# Patient Record
Sex: Female | Born: 1935
Health system: Southern US, Community
[De-identification: ages and names within clinical notes are randomized; demographics above are authoritative.]

## PROBLEM LIST (undated history)

## (undated) HISTORY — PX: CHOLECYSTECTOMY: SHX55

---

## 2017-03-26 ENCOUNTER — Encounter: Payer: Self-pay | Admitting: Emergency Medicine

## 2017-03-26 ENCOUNTER — Emergency Department (INDEPENDENT_AMBULATORY_CARE_PROVIDER_SITE_OTHER)
Admission: EM | Admit: 2017-03-26 | Discharge: 2017-03-26 | Disposition: A | Discharge status: Home / Self Care | Payer: Medicare Other | Source: Home / Self Care

## 2017-03-26 ENCOUNTER — Emergency Department (INDEPENDENT_AMBULATORY_CARE_PROVIDER_SITE_OTHER): Payer: Medicare Other

## 2017-03-26 DIAGNOSIS — J40 Bronchitis, not specified as acute or chronic: Secondary | ICD-10-CM

## 2017-03-26 DIAGNOSIS — R05 Cough: Secondary | ICD-10-CM

## 2017-03-26 DIAGNOSIS — R059 Cough, unspecified: Secondary | ICD-10-CM

## 2017-03-26 MED ORDER — AZITHROMYCIN 250 MG PO TABS: 250.0000 mg | ORAL_TABLET | Freq: Every day | ORAL | 0 refills | Status: DC

## 2017-03-26 MED ORDER — BENZONATATE 100 MG PO CAPS: 100.0000 mg | ORAL_CAPSULE | Freq: Three times a day (TID) | ORAL | 0 refills | Status: DC

## 2017-03-26 NOTE — ED Triage Notes (Signed)
[facility] with [facility]/O cough productive white sputum, nasal congestion, chills, possible fever. Symptoms times two weeks, [place] She advised that she has mold growing in her apartment.

## 2017-03-26 NOTE — Discharge Instructions (Signed)
Return if any problems.

## 2017-03-27 NOTE — ED Provider Notes (Signed)
[facility]    CSN: [id number] Arrival date & time: 03/26/17  1142     History   Chief Complaint Chief Complaint  Patient presents with  . Cough  . Nasal Congestion  . Chills    HPI Brenda Donovan is a 83 y.o. female.   The history is provided by the patient. No language interpreter was used.  Cough  Cough characteristics:  Productive Sputum characteristics:  Nondescript Severity:  Moderate Onset quality:  Gradual Duration:  2 weeks Timing:  Constant Chronicity:  New Smoker: no   Relieved by:  Nothing Worsened by:  Nothing Ineffective treatments:  None tried Associated symptoms: no fever   Risk factors: no recent infection   Pt complains of cough an congestion.  Pt reports she has been sick for 2 weeks.    History reviewed. No pertinent past medical history.  There are no active problems to display for this patient.   History reviewed. No pertinent surgical history.  OB History   None      Home Medications    Prior to Admission medications   Medication Sig Start Date End Date Taking? Authorizing Provider  fluticasone (VERAMYST) 27.5 MCG/SPRAY nasal spray Place 2 sprays into the nose daily.   Yes [provider]  guaiFENesin (ROBITUSSIN) 100 MG/5ML SOLN Take 5 mLs by mouth every 4 (four) hours as needed for cough or to loosen phlegm.   Yes [provider]  azithromycin (ZITHROMAX) 250 MG tablet Take 1 tablet (250 mg total) by mouth daily. Take first 2 tablets together, then 1 every day until finished. 03/26/17   Elson AreasSofia, Elliott Quade K, PA-C  benzonatate (TESSALON) 100 MG capsule Take 1 capsule (100 mg total) by mouth every 8 (eight) hours. 03/26/17   Elson AreasSofia, Kassidie Hendriks K, PA-C    Family History History reviewed. No pertinent family history.  Social History Social History   Tobacco Use  . Smoking status: Never Smoker  . Smokeless tobacco: Never Used  Substance Use Topics  . Alcohol use: Never    Frequency: Never  . Drug use: Never       Allergies   Patient has no allergy information on record.   Review of Systems Review of Systems  Constitutional: Negative for fever.  Respiratory: Positive for cough.   All other systems reviewed and are negative.    Physical Exam Triage Vital Signs ED Triage Vitals  Enc Vitals Group     BP 03/26/17 1231 (!) 148/80     Pulse Rate 03/26/17 1231 (!) 59     Resp 03/26/17 1231 16     Temp 03/26/17 1231 98.2 F (36.8 C)     Temp Source 03/26/17 1231 Oral     SpO2 03/26/17 1231 100 %     Weight 03/26/17 1233 126 lb (57.2 kg)     Height 03/26/17 1233 4\' 10"  (1.473 m)     Head Circumference --      Peak Flow --      Pain Score 03/26/17 1233 (S) 0     Pain Loc --      Pain Edu? --      Excl. in GC? --    No data found.  Updated Vital Signs BP (!) 148/80 (BP Location: Left Arm)   Pulse (!) 59   Temp 98.2 F (36.8 C) (Oral)   Resp 16   Ht 4\' 10"  (1.473 m)   Wt 126 lb (57.2 kg)   SpO2 100%   BMI 26.33 kg/m  Visual Acuity Right Eye Distance:   Left Eye Distance:   Bilateral Distance:    Right Eye Near:   Left Eye Near:    Bilateral Near:     Physical Exam  Constitutional: She appears well-developed and well-nourished. No distress.  HENT:  Head: Normocephalic and atraumatic.  Right Ear: External ear normal.  Left Ear: External ear normal.  Nose: Nose normal.  Mouth/Throat: Oropharynx is clear and moist.  Eyes: Conjunctivae are normal.  Neck: Neck supple.  Cardiovascular: Normal rate and regular rhythm.  No murmur heard. Pulmonary/Chest: Effort normal and breath sounds normal. No respiratory distress.  Abdominal: Soft. There is no tenderness.  Musculoskeletal: She exhibits no edema.  Neurological: She is alert.  Skin: Skin is warm and dry.  Psychiatric: She has a normal mood and affect.  Nursing note and vitals reviewed.    UC Treatments / Results  Labs (all labs ordered are listed, but only abnormal results are displayed) Labs Reviewed - No  data to display  EKG None Radiology Dg Chest 2 View  Result Date: 03/26/2017 CLINICAL DATA:  83 year old with productive cough, nasal congestion and chills. EXAM: CHEST - 2 VIEW COMPARISON:  None. FINDINGS: Both lungs are clear. Heart and mediastinum are within normal limits. No large pleural effusions. Mild degenerative changes in the thoracic spine. IMPRESSION: No active cardiopulmonary disease. Electronically Signed   By: Richarda Overlie M.D.   On: 03/26/2017 12:54    Procedures Procedures (including critical care time)  Medications Ordered in UC Medications - No data to display   Initial Impression / Assessment and Plan / UC Course  I have reviewed the triage vital signs and the nursing notes.  Pertinent labs & imaging results that were available during my care of the patient were reviewed by me and considered in my medical decision making (see chart for details).       Final Clinical Impressions(s) / UC Diagnoses   Final diagnoses:  Cough  Bronchitis    ED Discharge Orders        Ordered    azithromycin (ZITHROMAX) 250 MG tablet  Daily     03/26/17 1308    benzonatate (TESSALON) 100 MG capsule  Every 8 hours     03/26/17 1308     An After Visit Summary was printed and given to the patient.  Controlled Substance Prescriptions [place] Controlled Substance Registry consulted? Not Applicable   Elson Areas, [place] 03/27/17 1444

## 2018-06-26 DIAGNOSIS — K219 Gastro-esophageal reflux disease without esophagitis: Secondary | ICD-10-CM | POA: Insufficient documentation

## 2018-07-26 IMAGING — DX DG CHEST 2V
2 series · 2 of 2 positions shown · non-contrast
Comparison: None.

CLINICAL DATA: 82-year-old with productive cough, nasal congestion
and chills.

EXAM:
CHEST - 2 VIEW

[chest pa]
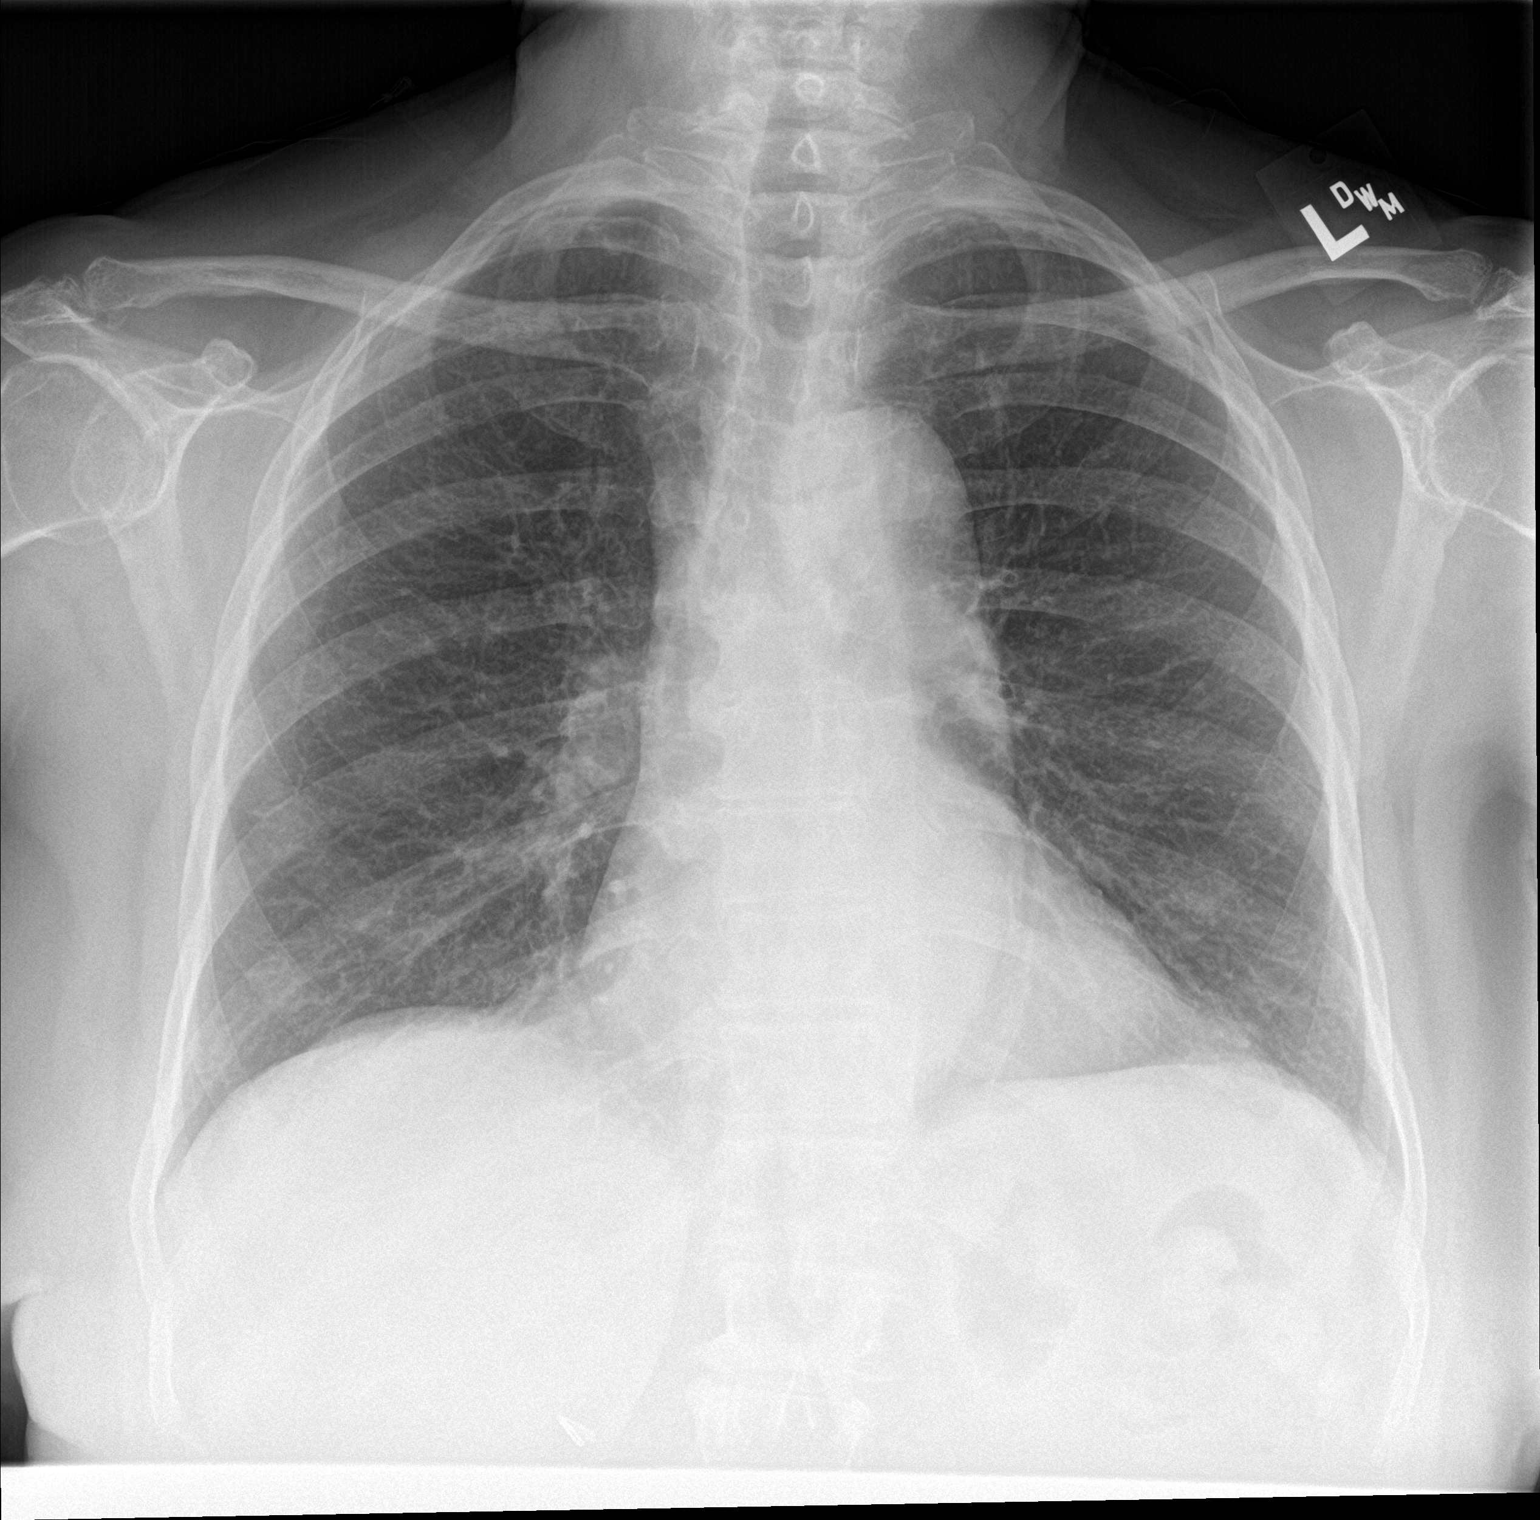

[chest lat]
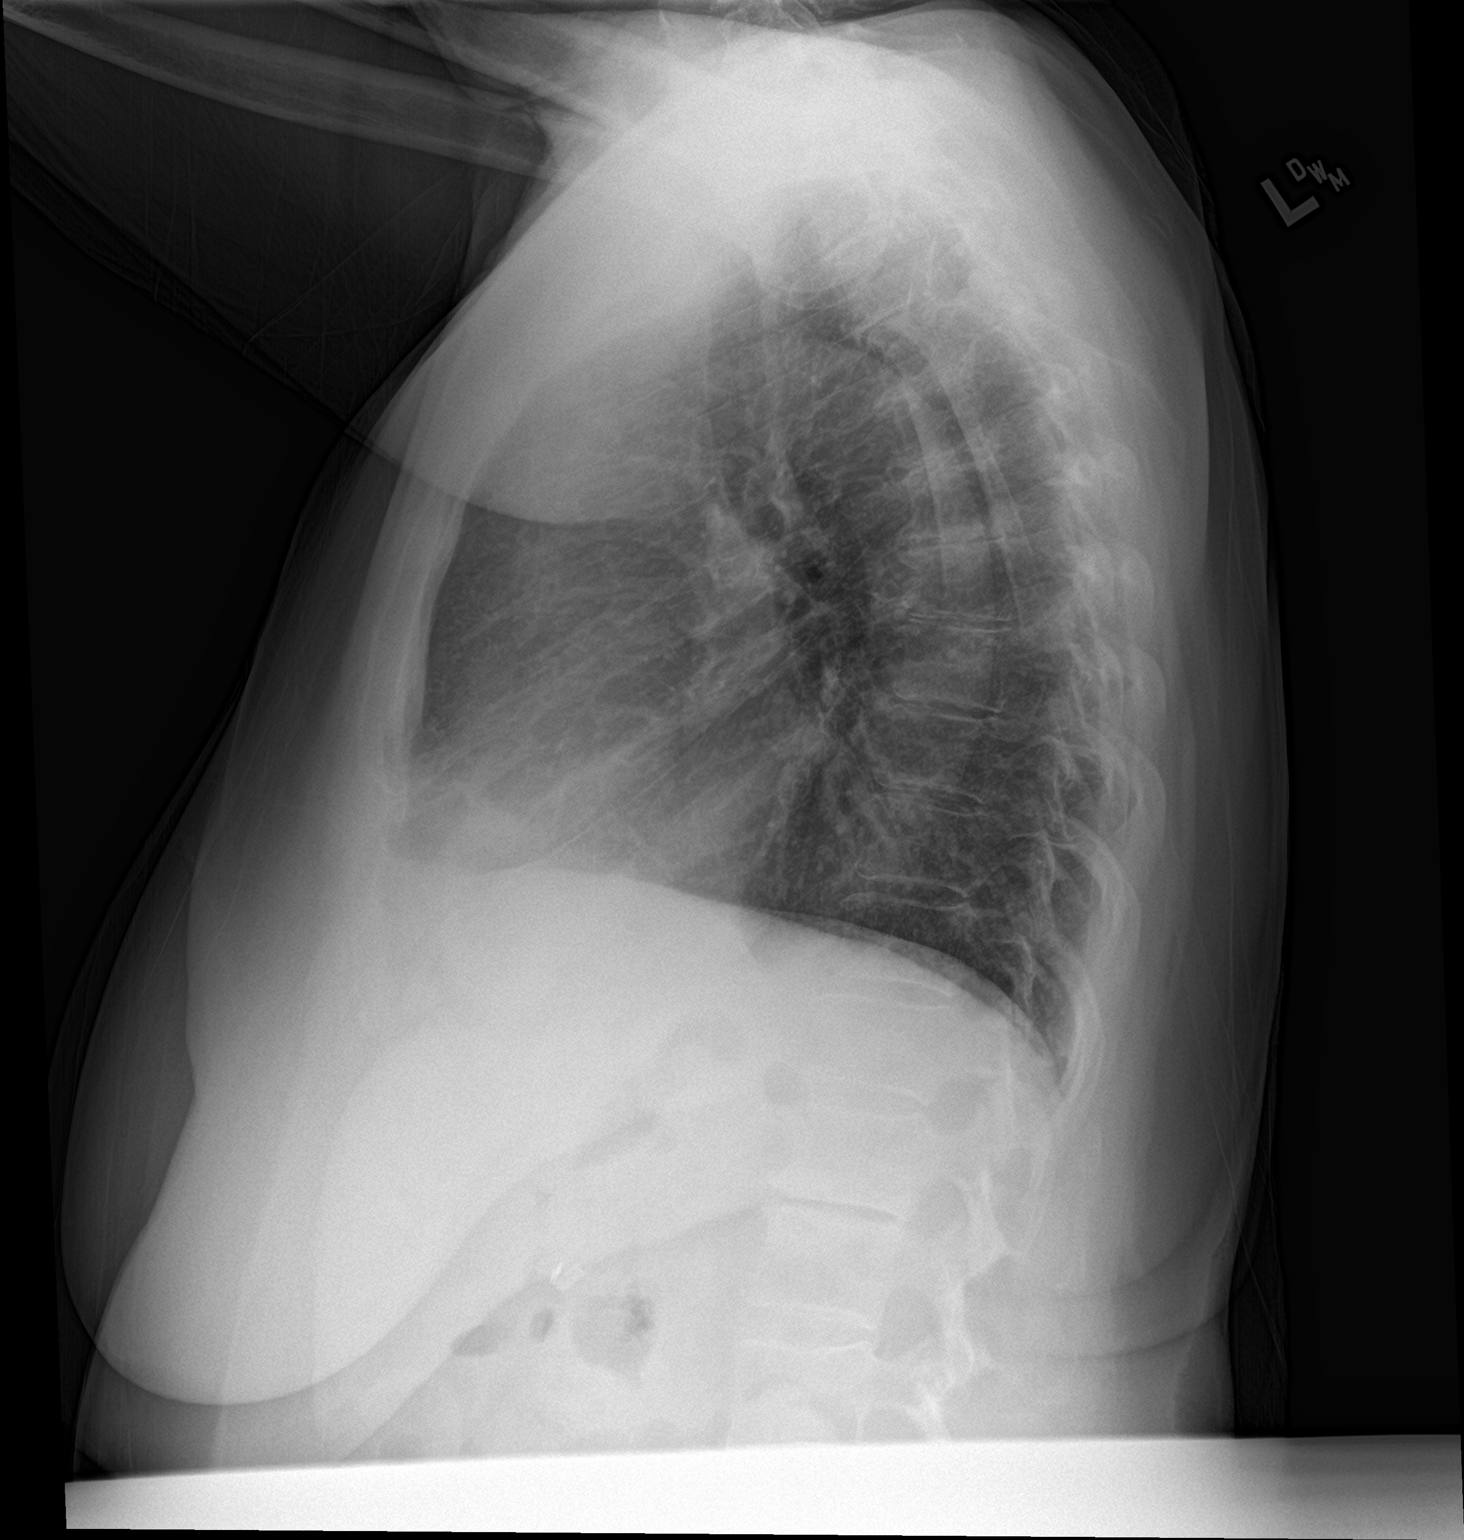

[2 of 2 positions shown; findings below may reference images not displayed]

FINDINGS: Both lungs are clear. Heart and mediastinum are within normal
limits. No large pleural effusions. Mild degenerative changes in the
thoracic spine.
IMPRESSION: No active cardiopulmonary disease.

## 2019-08-13 ENCOUNTER — Ambulatory Visit: Payer: Self-pay

## 2019-09-04 ENCOUNTER — Emergency Department (INDEPENDENT_AMBULATORY_CARE_PROVIDER_SITE_OTHER)
Admission: RE | Admit: 2019-09-04 | Discharge: 2019-09-04 | Disposition: A | Discharge status: Home / Self Care | Payer: Medicare Other | Source: Ambulatory Visit | Attending: Family Medicine | Admitting: Family Medicine

## 2019-09-04 ENCOUNTER — Other Ambulatory Visit: Payer: Self-pay

## 2019-09-04 VITALS — BP 119/78 | HR 64 | Temp 98.3°F | Resp 18 | Ht 59.0 in | Wt 120.0 lb

## 2019-09-04 DIAGNOSIS — M791 Myalgia, unspecified site: Secondary | ICD-10-CM | POA: Diagnosis not present

## 2019-09-04 MED ORDER — PREDNISONE 20 MG PO TABS: ORAL_TABLET | ORAL | 0 refills | Status: DC

## 2019-09-04 NOTE — Discharge Instructions (Signed)
May take Tylenol as needed for pain. 

## 2019-09-04 NOTE — ED Provider Notes (Signed)
[facility]    CSN: [id number] Arrival date & time: 09/04/19  4193      History   Chief Complaint Chief Complaint  Patient presents with  . Cough    HPI Brenda Donovan is a 85 y.o. female.   Patient is Spanish speaking and daughter serves as Engineer, technical sales. Patient complains of three day history of vague tightness in her upper posterior chest without shortness of breath or cough.  She denies fevers, chills, and sweats and feels well otherwise.  She reports that she has a history of seasonal allergic rhinitis and sometimes has chest aches during flare-ups.  The history is provided by the patient and a relative. The history is limited by a language barrier. No language interpreter was used.    History reviewed. No pertinent past medical history.  There are no problems to display for this patient.   History reviewed. No pertinent surgical history.  OB History   No obstetric history on file.      Home Medications    Prior to Admission medications   Medication Sig Start Date End Date Taking? Authorizing Provider  guaiFENesin (ROBITUSSIN) 100 MG/5ML SOLN Take 5 mLs by mouth every 4 (four) hours as needed for cough or to loosen phlegm.    [provider]  predniSONE (DELTASONE) 20 MG tablet Take one tab by mouth twice daily for 4 days, then one daily for 3 days. Take with food. 09/04/19   Lattie Haw, MD    Family History History reviewed. No pertinent family history.  Social History Social History   Tobacco Use  . Smoking status: Never Smoker  . Smokeless tobacco: Never Used  Vaping Use  . Vaping Use: Never used  Substance Use Topics  . Alcohol use: Never  . Drug use: Never     Allergies   Patient has no known allergies.   Review of Systems Review of Systems  No sore throat No cough No pleuritic pain No wheezing No nasal congestion No post-nasal drainage No sinus pain/pressure No itchy/red eyes No earache No hemoptysis No  SOB No fever/chills No nausea No vomiting No abdominal pain No diarrhea No urinary symptoms No skin rash No fatigue No myalgias No headache    Physical Exam Triage Vital Signs ED Triage Vitals  Enc Vitals Group     BP 09/04/19 1209 119/78     Pulse Rate 09/04/19 1209 64     Resp 09/04/19 1209 18     Temp 09/04/19 1209 98.3 F (36.8 C)     Temp Source 09/04/19 1209 Oral     SpO2 09/04/19 1209 96 %     Weight 09/04/19 1210 120 lb (54.4 kg)     Height 09/04/19 1210 4\' 11"  (1.499 m)     Head Circumference --      Peak Flow --      Pain Score 09/04/19 1209 2     Pain Loc --      Pain Edu? --      Excl. in GC? --    No data found.  Updated Vital Signs BP 119/78 (BP Location: Right Arm)   Pulse 64   Temp 98.3 F (36.8 C) (Oral)   Resp 18   Ht 4\' 11"  (1.499 m)   Wt 54.4 kg   SpO2 96%   BMI 24.24 kg/m   Visual Acuity Right Eye Distance:   Left Eye Distance:   Bilateral Distance:    Right Eye Near:   Left  Eye Near:    Bilateral Near:     Physical Exam Nursing notes and Vital Signs reviewed. Appearance:  Patient appears stated age, and in no acute distress Eyes:  Pupils are equal, round, and reactive to light and accomodation.  Extraocular movement is intact.  Conjunctivae are not inflamed  Ears:  Canals normal.  Tympanic membranes normal.  Nose:  Mildly congested turbinates.  No sinus tenderness.  Neck:  Supple.  Mildly enlarged lateral nodes are present, tender to palpation on the left.   Lungs:  Clear to auscultation.  Breath sounds are equal.  Moving air well. Chest:  Mild posterior upper thoracic tenderness to palpation. Heart:  Regular rate and rhythm without murmurs, rubs, or gallops.  Abdomen:  Nontender without masses or hepatosplenomegaly.  Bowel sounds are present.  No CVA or flank tenderness.  Extremities:  No edema.  Skin:  No rash present.   UC Treatments / Results  Labs (all labs ordered are listed, but only abnormal results are  displayed) Labs Reviewed - No data to display  EKG   Radiology No results found.  Procedures Procedures (including critical care time)  Medications Ordered in UC Medications - No data to display  Initial Impression / Assessment and Plan / UC Course  I have reviewed the triage vital signs and the nursing notes.  Pertinent labs & imaging results that were available during my care of the patient were reviewed by me and considered in my medical decision making (see chart for details).    Suspect an early viral URI causing patient's upper back soreness Begin prednisone burst/taper. Followup with Family Doctor if not improved in one week.   Final Clinical Impressions(s) / UC Diagnoses   Final diagnoses:  Myalgia     Discharge Instructions     May take Tylenol as needed for pain.   ED Prescriptions    Medication Sig Dispense Auth. Provider   predniSONE (DELTASONE) 20 MG tablet Take one tab by mouth twice daily for 4 days, then one daily for 3 days. Take with food. 11 tablet Lattie Haw, MD        Lattie Haw, MD 09/06/19 1140

## 2019-09-04 NOTE — ED Triage Notes (Signed)
Dry cough and congestion x 2 days Vaccinated

## 2019-12-10 ENCOUNTER — Other Ambulatory Visit: Payer: Self-pay

## 2019-12-10 ENCOUNTER — Emergency Department (INDEPENDENT_AMBULATORY_CARE_PROVIDER_SITE_OTHER): Payer: Medicare Other

## 2019-12-10 ENCOUNTER — Emergency Department (INDEPENDENT_AMBULATORY_CARE_PROVIDER_SITE_OTHER)
Admission: RE | Admit: 2019-12-10 | Discharge: 2019-12-10 | Disposition: A | Discharge status: Home / Self Care | Payer: Medicare Other | Source: Ambulatory Visit

## 2019-12-10 VITALS — BP 102/68 | HR 69 | Temp 99.0°F | Resp 17 | Ht 59.0 in | Wt 121.0 lb

## 2019-12-10 DIAGNOSIS — J069 Acute upper respiratory infection, unspecified: Secondary | ICD-10-CM | POA: Diagnosis not present

## 2019-12-10 DIAGNOSIS — R0989 Other specified symptoms and signs involving the circulatory and respiratory systems: Secondary | ICD-10-CM

## 2019-12-10 MED ORDER — FLUTICASONE PROPIONATE 50 MCG/ACT NA SUSP: 1.0000 | Freq: Every day | NASAL | 0 refills | Status: DC

## 2019-12-10 NOTE — ED Triage Notes (Signed)
Sore throat & chills x 1 week  Pfizer vaccine 3/21 - due for booster Had flu vaccine  No Tylenol today  Sinus drainage

## 2019-12-10 NOTE — Discharge Instructions (Signed)
  You may use the nasal spray, Flonase, to help with your symptoms. Follow up with family medicine later this week or next week if not improving, especially if new symptoms develop- cough, fever, pain.

## 2019-12-10 NOTE — ED Provider Notes (Signed)
[facility]    CSN: [id number] Arrival date & time: 12/10/19  1800      History   Chief Complaint Chief Complaint  Patient presents with  . Appointment  . Chills  . Sore Throat    HPI Brenda Donovan is a 85 y.o. female.   HPI Brenda Donovan is a 85 y.o. female presenting to UC with c/o nasal congestion, post-nasal drainage, sore throat.  Hx of sinus allergies. No known sick contacts.  No cough. No fever. Pfizer COVID vaccine in March 2021.     History reviewed. No pertinent past medical history.  Patient Active Problem List   Diagnosis Date Noted  . Gastroesophageal reflux disease without esophagitis 06/26/2018    History reviewed. No pertinent surgical history.  OB History   No obstetric history on file.      Home Medications    Prior to Admission medications   Medication Sig Start Date End Date Taking? Authorizing Provider  Calcium Carbonate-Vitamin D 600-400 MG-UNIT tablet Take 1 tablet by mouth daily. 02/19/19  Yes [provider]  cetirizine (ZYRTEC) 10 MG tablet Take by mouth.   Yes [provider]  dorzolamide-timolol (COSOPT) 22.3-6.8 MG/ML ophthalmic solution Place 1 drop into both eyes 2 (two) times daily. 12/13/18  Yes [provider]  esomeprazole (NEXIUM) 20 MG capsule Take by mouth. 02/19/19  Yes [provider]  latanoprost (XALATAN) 0.005 % ophthalmic solution Place 1 drop into both eyes at bedtime. 01/10/19  Yes [provider]  fluticasone (FLONASE) 50 MCG/ACT nasal spray Place 1-2 sprays into both nostrils daily. 12/10/19   Lurene Shadow, PA-C  guaiFENesin (ROBITUSSIN) 100 MG/5ML SOLN Take 5 mLs by mouth every 4 (four) hours as needed for cough or to loosen phlegm. Patient not taking: Reported on 12/10/2019    [provider]  predniSONE (DELTASONE) 20 MG tablet Take one tab by mouth twice daily for 4 days, then one daily for 3 days. Take with food. Patient not taking: Reported on  12/10/2019 09/04/19   Lattie Haw, MD    Family History History reviewed. No pertinent family history.  Social History Social History   Tobacco Use  . Smoking status: Never Smoker  . Smokeless tobacco: Never Used  Vaping Use  . Vaping Use: Never used  Substance Use Topics  . Alcohol use: Never  . Drug use: Never     Allergies   Patient has no known allergies.   Review of Systems Review of Systems  Constitutional: Negative for chills and fever.  HENT: Positive for congestion, postnasal drip, rhinorrhea and sore throat. Negative for ear pain, trouble swallowing and voice change.   Respiratory: Negative for cough and shortness of breath.   Cardiovascular: Negative for chest pain and palpitations.  Gastrointestinal: Negative for abdominal pain, diarrhea, nausea and vomiting.  Musculoskeletal: Negative for arthralgias, back pain and myalgias.  Skin: Negative for rash.  All other systems reviewed and are negative.    Physical Exam Triage Vital Signs ED Triage Vitals  Enc Vitals Group     BP 12/10/19 1815 102/68     Pulse Rate 12/10/19 1815 69     Resp 12/10/19 1815 17     Temp 12/10/19 1815 99 F (37.2 C)     Temp Source 12/10/19 1815 Oral     SpO2 12/10/19 1815 95 %     Weight 12/10/19 1818 121 lb (54.9 kg)     Height 12/10/19 1818 4\' 11"  (1.499 m)  Head Circumference --      Peak Flow --      Pain Score 12/10/19 1817 1     Pain Loc --      Pain Edu? --      Excl. in GC? --    No data found.  Updated Vital Signs BP 102/68 (BP Location: Right Arm)   Pulse 69   Temp 99 F (37.2 C) (Oral)   Resp 17   Ht 4\' 11"  (1.499 m)   Wt 121 lb (54.9 kg)   SpO2 95%   BMI 24.44 kg/m   Visual Acuity Right Eye Distance:   Left Eye Distance:   Bilateral Distance:    Right Eye Near:   Left Eye Near:    Bilateral Near:     Physical Exam Vitals and nursing note reviewed.  Constitutional:      General: She is not in acute distress.    Appearance: She is  well-developed. She is not ill-appearing, toxic-appearing or diaphoretic.  HENT:     Head: Normocephalic and atraumatic.     Right Ear: Tympanic membrane and ear canal normal.     Left Ear: Tympanic membrane and ear canal normal.     Nose: Nose normal.     Right Sinus: No maxillary sinus tenderness or frontal sinus tenderness.     Left Sinus: No maxillary sinus tenderness or frontal sinus tenderness.     Mouth/Throat:     Lips: Pink.     Mouth: Mucous membranes are moist.     Pharynx: Oropharynx is clear. Uvula midline.  Cardiovascular:     Rate and Rhythm: Normal rate and regular rhythm.  Pulmonary:     Effort: Pulmonary effort is normal. No respiratory distress.     Breath sounds: No stridor. Rhonchi (faint in lower lung field) present. No wheezing or rales.  Musculoskeletal:        General: Normal range of motion.     Cervical back: Normal range of motion and neck supple.  Lymphadenopathy:     Cervical: No cervical adenopathy.  Skin:    General: Skin is warm and dry.  Neurological:     Mental Status: She is alert and oriented to person, place, and time.  Psychiatric:        Behavior: Behavior normal.      UC Treatments / Results  Labs (all labs ordered are listed, but only abnormal results are displayed) Labs Reviewed  COVID-19, FLU A+B AND RSV    EKG   Radiology DG Chest 2 View  Result Date: 12/10/2019 CLINICAL DATA:  Chills, chest congestion, faint rhonchi lower lung fields. EXAM: CHEST - 2 VIEW COMPARISON:  Chest x-ray 03/26/2017 FINDINGS: The heart size and mediastinal contours are within normal limits. Trace biapical pleural/pulmonary scarring. Left base atelectasis. No focal consolidation. No pulmonary edema. No pleural effusion. No pneumothorax. No acute osseous abnormality.  Right upper abdomen surgical clips. IMPRESSION: No active cardiopulmonary disease. Electronically Signed   By: 03/28/2017 M.D.   On: 12/10/2019 19:17    Procedures Procedures  (including critical care time)  Medications Ordered in UC Medications - No data to display  Initial Impression / Assessment and Plan / UC Course  I have reviewed the triage vital signs and the nursing notes.  Pertinent labs & imaging results that were available during my care of the patient were reviewed by me and considered in my medical decision making (see chart for details).     No evidence of  bacteria infection at this time. Encouraged symptomatic tx F/u with PCP as needed  Final Clinical Impressions(s) / UC Diagnoses   Final diagnoses:  Viral upper respiratory illness     Discharge Instructions      You may use the nasal spray, Flonase, to help with your symptoms. Follow up with family medicine later this week or next week if not improving, especially if new symptoms develop- cough, fever, pain.     ED Prescriptions    Medication Sig Dispense Auth. Provider   fluticasone (FLONASE) 50 MCG/ACT nasal spray Place 1-2 sprays into both nostrils daily. 16 g Lurene Shadow, [place]     PDMP not reviewed this encounter.   Lurene Shadow, [place] 12/10/19 [phone]

## 2019-12-13 LAB — COVID-19, FLU A+B AND RSV
Influenza A, NAA: NOT DETECTED
Influenza B, NAA: NOT DETECTED
RSV, NAA: NOT DETECTED
SARS-CoV-2, NAA: NOT DETECTED

## 2021-02-16 ENCOUNTER — Other Ambulatory Visit: Payer: Self-pay

## 2021-02-16 ENCOUNTER — Emergency Department (INDEPENDENT_AMBULATORY_CARE_PROVIDER_SITE_OTHER)
Admission: RE | Admit: 2021-02-16 | Discharge: 2021-02-16 | Disposition: A | Discharge status: Home / Self Care | Payer: Medicare Other | Source: Ambulatory Visit | Attending: Family Medicine | Admitting: Family Medicine

## 2021-02-16 VITALS — BP 119/90 | HR 60 | Temp 98.1°F | Resp 14

## 2021-02-16 DIAGNOSIS — H6983 Other specified disorders of Eustachian tube, bilateral: Secondary | ICD-10-CM

## 2021-02-16 DIAGNOSIS — H6993 Unspecified Eustachian tube disorder, bilateral: Secondary | ICD-10-CM

## 2021-02-16 LAB — POC INFLUENZA A AND B ANTIGEN (URGENT CARE ONLY)
Influenza A Ag: NEGATIVE
Influenza B Ag: NEGATIVE

## 2021-02-16 LAB — POC SARS CORONAVIRUS 2 AG -  ED: SARS Coronavirus 2 Ag: NEGATIVE

## 2021-02-16 MED ORDER — FLUTICASONE PROPIONATE 50 MCG/ACT NA SUSP: 1.0000 | Freq: Every day | NASAL | 0 refills | Status: AC

## 2021-02-16 NOTE — ED Provider Notes (Signed)
[facility]    CSN: [id number] Arrival date & time: 02/16/21  1549      History   Chief Complaint Chief Complaint  Patient presents with   Chills   Otalgia    HPI Ellana Kawa is a 87 y.o. female.   HPI  Charnetta is here with her daughter.  Daughter is fluently bilingual.  The patient's English is also good. She has ear problems for the last month or so.  Decreased sensation.  Funny noises.  Itching.  No pain.  No loss of hearing. She has longstanding allergies.  Takes Zyrtec daily. For the last couple of days she has had chills.  Her daughter states that she is always cold.  Has not been having a fever  History reviewed. No pertinent past medical history.  Patient Active Problem List   Diagnosis Date Noted   Gastroesophageal reflux disease without esophagitis 06/26/2018    Past Surgical History:  Procedure Laterality Date   CHOLECYSTECTOMY      OB History   No obstetric history on file.      Home Medications    Prior to Admission medications   Medication Sig Start Date End Date Taking? Authorizing Provider  Calcium Carbonate-Vitamin D 600-400 MG-UNIT tablet Take 1 tablet by mouth daily. 02/19/19   [provider]  cetirizine (ZYRTEC) 10 MG tablet Take by mouth.    [provider]  dorzolamide-timolol (COSOPT) 22.3-6.8 MG/ML ophthalmic solution Place 1 drop into both eyes 2 (two) times daily. 12/13/18   [provider]  esomeprazole (NEXIUM) 20 MG capsule Take by mouth. 02/19/19   [provider]  fluticasone (FLONASE) 50 MCG/ACT nasal spray Place 1-2 sprays into both nostrils daily. 02/16/21   Eustace Moore, MD  latanoprost (XALATAN) 0.005 % ophthalmic solution Place 1 drop into both eyes at bedtime. 01/10/19   [provider]    Family History History reviewed. No pertinent family history.  Social History Social History   Tobacco Use   Smoking status: Never   Smokeless tobacco: Never  Vaping Use    Vaping Use: Never used  Substance Use Topics   Alcohol use: Never   Drug use: Never     Allergies   Patient has no known allergies.   Review of Systems Review of Systems   Physical Exam Triage Vital Signs ED Triage Vitals  Enc Vitals Group     BP 02/16/21 1602 119/90     Pulse Rate 02/16/21 1602 60     Resp 02/16/21 1602 14     Temp 02/16/21 1602 98.1 F (36.7 C)     Temp Source 02/16/21 1602 Oral     SpO2 02/16/21 1602 99 %     Weight --      Height --      Head Circumference --      Peak Flow --      Pain Score 02/16/21 1604 0     Pain Loc --      Pain Edu? --      Excl. in GC? --    No data found.  Updated Vital Signs BP 119/90 (BP Location: Right Arm)    Pulse 60    Temp 98.1 F (36.7 C) (Oral)    Resp 14    SpO2 99%       Physical Exam Constitutional:      General: She is not in acute distress.    Appearance: Normal appearance. She is well-developed.  HENT:  Head: Normocephalic and atraumatic.     Right Ear: Ear canal and external ear normal.     Left Ear: Ear canal and external ear normal.     Ears:     Comments: Both TMs distorted, no erythema    Nose: Nose normal. No congestion.     Mouth/Throat:     Pharynx: No posterior oropharyngeal erythema.  Eyes:     Conjunctiva/sclera: Conjunctivae normal.     Pupils: Pupils are equal, round, and reactive to light.  Cardiovascular:     Rate and Rhythm: Normal rate.  Pulmonary:     Effort: Pulmonary effort is normal. No respiratory distress.  Abdominal:     General: There is no distension.     Palpations: Abdomen is soft.  Musculoskeletal:        General: Normal range of motion.     Cervical back: Normal range of motion.  Lymphadenopathy:     Cervical: No cervical adenopathy.  Skin:    General: Skin is warm and dry.  Neurological:     Mental Status: She is alert.  Psychiatric:        Mood and Affect: Mood normal.        Behavior: Behavior normal.     UC Treatments / Results   Labs (all labs ordered are listed, but only abnormal results are displayed) Labs Reviewed  POC SARS CORONAVIRUS 2 AG -  ED  POC INFLUENZA A AND B ANTIGEN (URGENT CARE ONLY)    EKG   Radiology No results found.  Procedures Procedures (including critical care time)  Medications Ordered in UC Medications - No data to display  Initial Impression / Assessment and Plan / UC Course  I have reviewed the triage vital signs and the nursing notes.  Pertinent labs & imaging results that were available during my care of the patient were reviewed by me and considered in my medical decision making (see chart for details).     Final Clinical Impressions(s) / UC Diagnoses   Final diagnoses:  Dysfunction of both eustachian tubes     Discharge Instructions        Continue zyrtec every day Add flonase daily Drink lots of water   ED Prescriptions     Medication Sig Dispense Auth. Provider   fluticasone (FLONASE) 50 MCG/ACT nasal spray Place 1-2 sprays into both nostrils daily. 16 g Eustace Moore, MD      PDMP not reviewed this encounter.   Eustace Moore, MD 02/16/21 [phone]

## 2021-02-16 NOTE — ED Triage Notes (Signed)
Pt presents with bilateral ear itching and a sensation of someone "blowing" in her ears x1 month. She is also experiencing chills

## 2021-02-16 NOTE — Discharge Instructions (Addendum)
° °  Continue zyrtec every day Add flonase daily Drink lots of water

## 2021-04-10 IMAGING — DX DG CHEST 2V
2 series · 2 of 2 positions shown · non-contrast
Comparison: Chest x-ray 03/26/2017

CLINICAL DATA: Chills, chest congestion, faint rhonchi lower lung
fields.

EXAM:
CHEST - 2 VIEW

[chest pa]
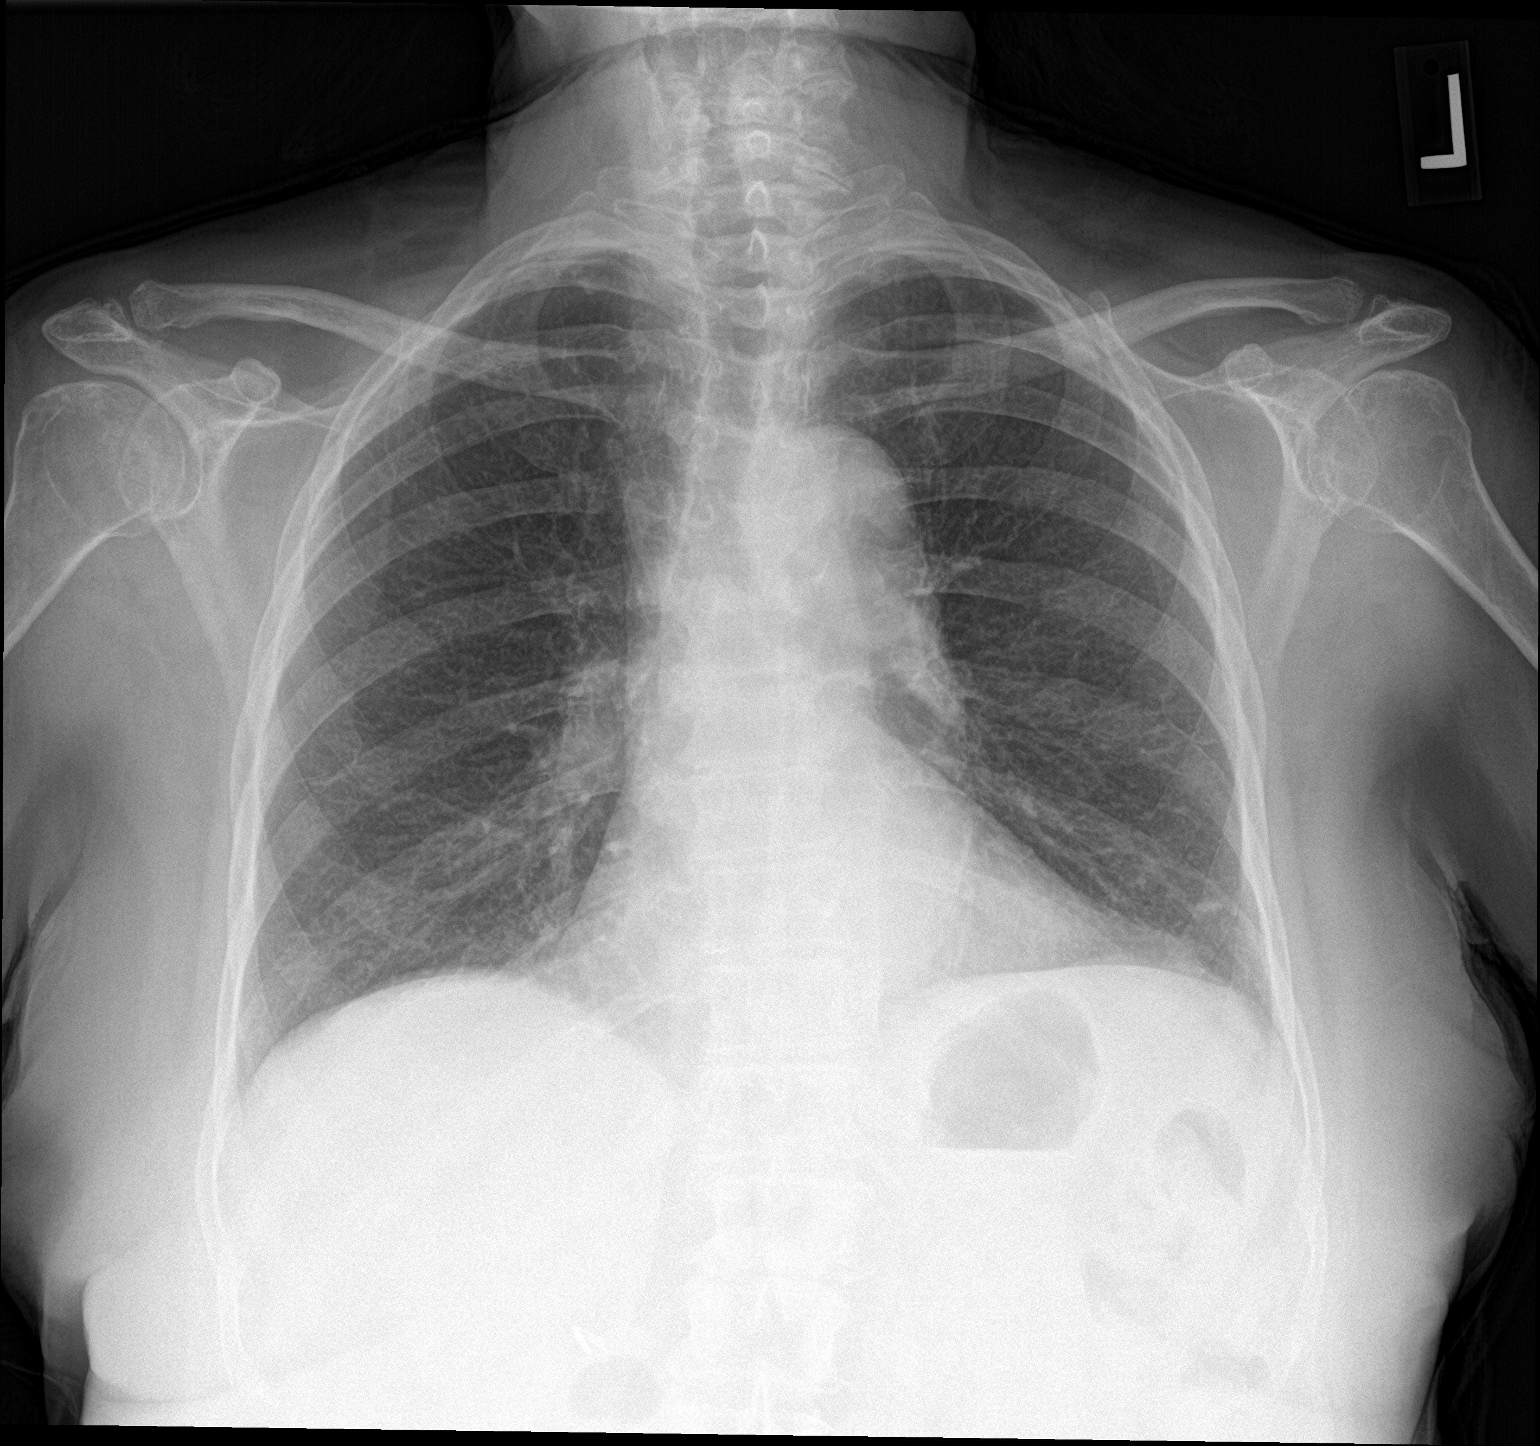

[chest lat]
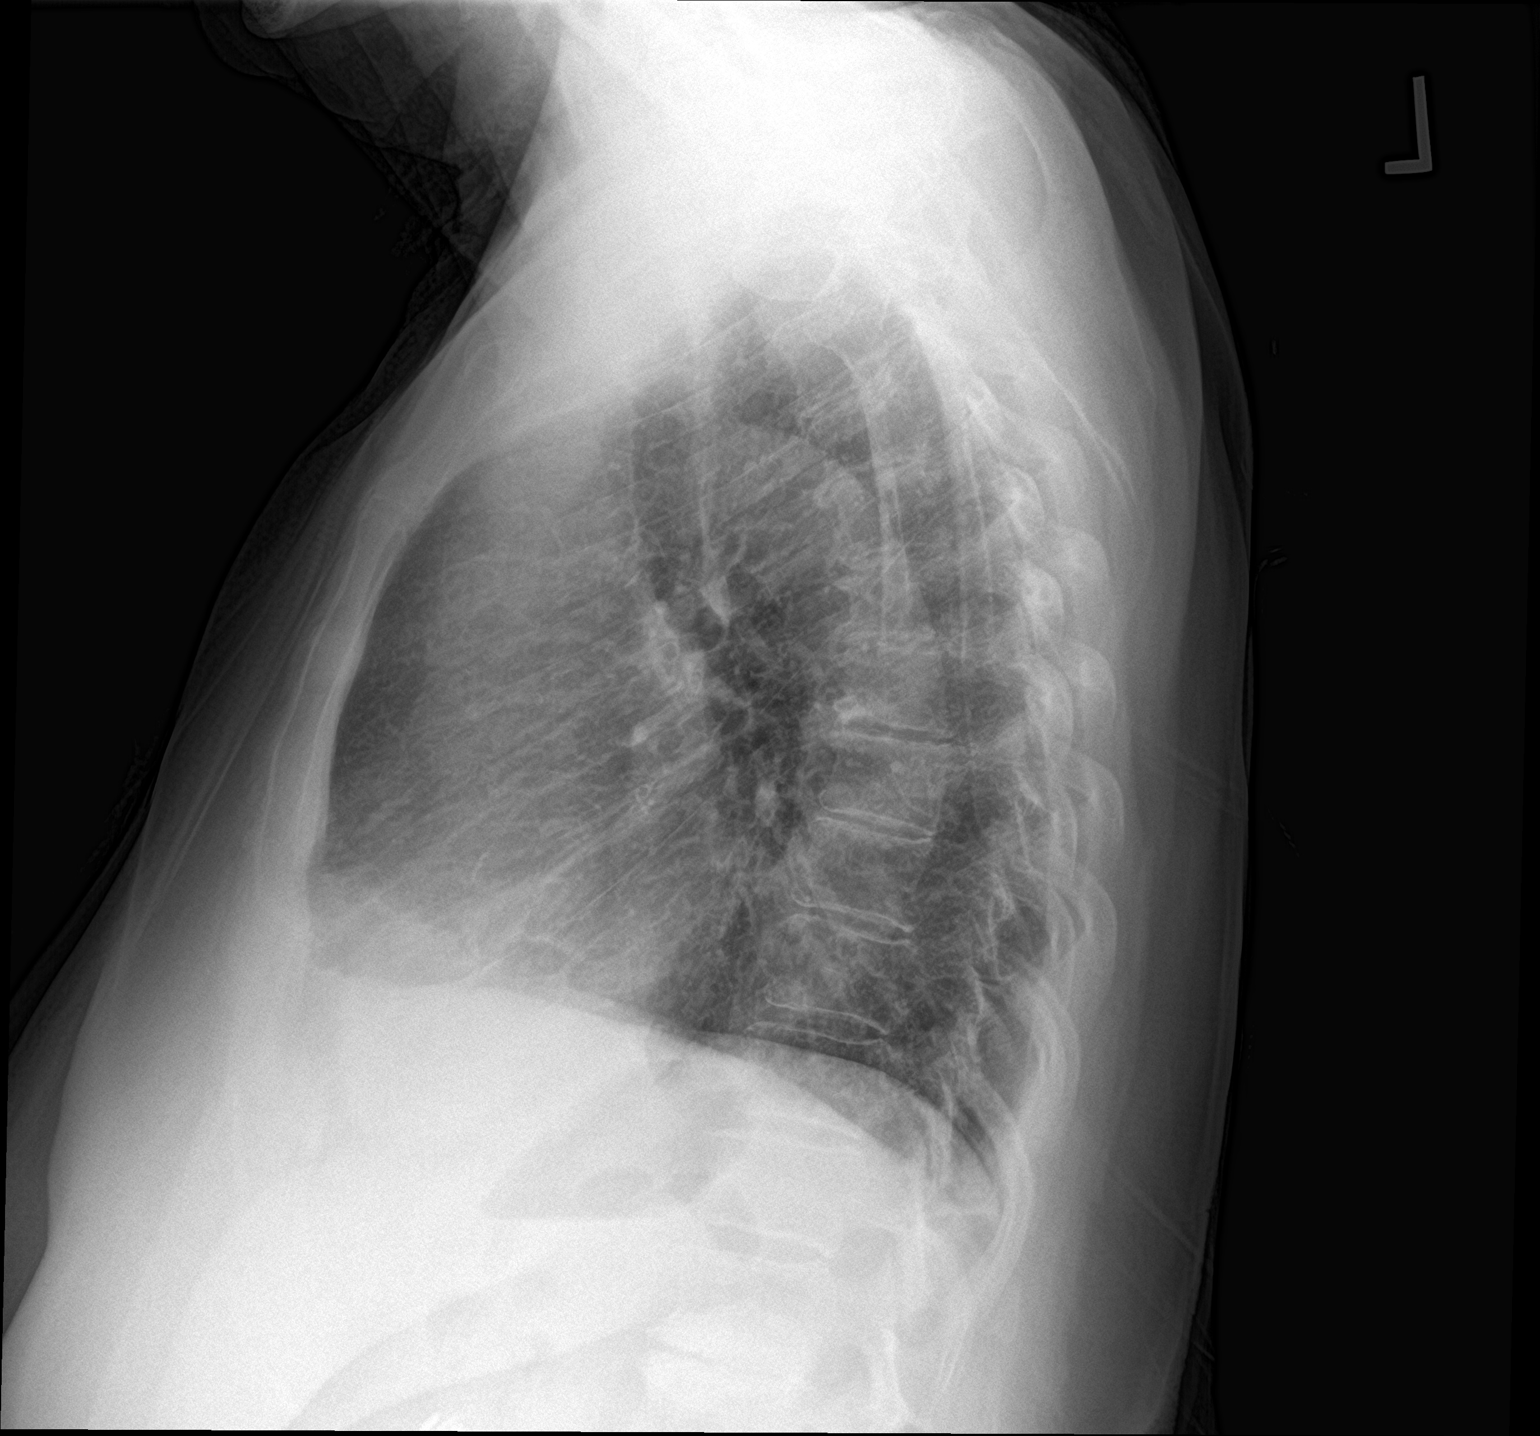

[2 of 2 positions shown; findings below may reference images not displayed]

FINDINGS: The heart size and mediastinal contours are within normal limits.

Trace biapical pleural/pulmonary scarring. Left base atelectasis. No
focal consolidation. No pulmonary edema. No pleural effusion. No
pneumothorax.

No acute osseous abnormality.  Right upper abdomen surgical clips.
IMPRESSION: No active cardiopulmonary disease.
